# Patient Record
Sex: Male | Born: 2000 | Race: Black or African American | Hispanic: No | Marital: Single | Smoking: Never smoker
Health system: Southern US, Community
[De-identification: ages and names within clinical notes are randomized; demographics above are authoritative.]

## PROBLEM LIST (undated history)

## (undated) DIAGNOSIS — F32A Depression, unspecified: Secondary | ICD-10-CM

---

## 2020-03-06 ENCOUNTER — Ambulatory Visit (INDEPENDENT_AMBULATORY_CARE_PROVIDER_SITE_OTHER): Payer: BC Managed Care – PPO

## 2020-03-06 ENCOUNTER — Other Ambulatory Visit: Payer: Self-pay

## 2020-03-06 ENCOUNTER — Encounter (HOSPITAL_COMMUNITY): Payer: Self-pay

## 2020-03-06 ENCOUNTER — Ambulatory Visit (HOSPITAL_COMMUNITY)
Admission: EM | Admit: 2020-03-06 | Discharge: 2020-03-06 | Disposition: A | Discharge status: Home / Self Care | Payer: BC Managed Care – PPO

## 2020-03-06 DIAGNOSIS — S90212A Contusion of left great toe with damage to nail, initial encounter: Secondary | ICD-10-CM

## 2020-03-06 DIAGNOSIS — M79675 Pain in left toe(s): Secondary | ICD-10-CM | POA: Diagnosis not present

## 2020-03-06 MED ORDER — NAPROXEN 500 MG PO TABS: 500.0000 mg | ORAL_TABLET | Freq: Two times a day (BID) | ORAL | 0 refills | Status: AC

## 2020-03-06 NOTE — ED Triage Notes (Signed)
Pt c/o 5/10 throbbing pain in 1st left digit of foot. Pt states he is able to lift his toe nail up. Pt states there was yellow pus coming from under toe nail yesterday. Pt's toe nail is cracked all the way across nail. Pt states he plays football and thinks that's how he may have injured it.

## 2020-03-06 NOTE — ED Provider Notes (Signed)
[facility]    CSN: [id number] Arrival date & time: 03/06/20  1237      History   Chief Complaint Chief Complaint  Patient presents with  . Toe Pain    HPI Rodney Castillo is a 19 y.o. male.   Patient reports for left big toe pain.  Reports has been going on for about a week.  He reports last night the toe appeared white and had some yellow drainage.  Reports he is also been out of lift the toenail.  He reports prior to this there was some black and blue under the toe nail.  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  Reports pain with walking.  Reports pain with moving the toenail.  Reports there is no swelling at the base of the toenail or around the toenail.  Reports he saw his athletic trainer and they were trying to wrap it to support him while he is playing.     History reviewed. No pertinent past medical history.  There are no problems to display for this patient.   History reviewed. No pertinent surgical history.     Home Medications    Prior to Admission medications   Medication Sig Start Date End Date Taking? Authorizing Provider  cetirizine (ZYRTEC ALLERGY) 10 MG tablet Take 10 mg by mouth daily.   Yes [provider]  naproxen (NAPROSYN) 500 MG tablet Take 1 tablet (500 mg total) by mouth 2 (two) times daily for 7 days. 03/06/20 03/13/20  Shawnika Pepin, Veryl Speak, PA-C    Family History No family history on file.  Social History Social History   Tobacco Use  . Smoking status: Never Smoker  . Smokeless tobacco: Never Used  Substance Use Topics  . Alcohol use: Never  . Drug use: Never     Allergies   Patient has no known allergies.   Review of Systems Review of Systems   Physical Exam Triage Vital Signs ED Triage Vitals  Enc Vitals Group     BP 03/06/20 1326 127/70     Pulse Rate 03/06/20 1326 (!) 48     Resp 03/06/20 1326 16     Temp 03/06/20 1326 97.7 F (36.5 C)     Temp Source 03/06/20  1326 Oral     SpO2 03/06/20 1326 100 %     Weight 03/06/20 1329 175 lb (79.4 kg)     Height 03/06/20 1329 6\' 3"  (1.905 m)     Head Circumference --      Peak Flow --      Pain Score 03/06/20 1329 5     Pain Loc --      Pain Edu? --      Excl. in GC? --    No data found.  Updated Vital Signs BP 127/70   Pulse (!) 48   Temp 97.7 F (36.5 C) (Oral)   Resp 16   Ht 6\' 3"  (1.905 m)   Wt 175 lb (79.4 kg)   SpO2 100%   BMI 21.87 kg/m   Visual Acuity Right Eye Distance:   Left Eye Distance:   Bilateral Distance:    Right Eye Near:   Left Eye Near:    Bilateral Near:     Physical Exam Vitals and nursing note reviewed.  Constitutional:      Appearance: Normal appearance.  Cardiovascular:     Rate and Rhythm: Normal rate.  Musculoskeletal:  Comments: Left great toe with slight ecchymosis under toenail.  Distal nail is becoming detached from nail bed.  Significant pain with manipulation of toenail.  No erythema or swelling proximal cuticle or in the lateral nail folds.  No active drainage.  Joint is not warm.  Patient able to move the toe.  Some pain with palpation and with bearing weight.  Cap refill less than 2 seconds sensation intact.  Neurological:     General: No focal deficit present.     Mental Status: He is alert and oriented to person, place, and time.      UC Treatments / Results  Labs (all labs ordered are listed, but only abnormal results are displayed) Labs Reviewed - No data to display  EKG   Radiology DG Toe Great Left  Result Date: 03/06/2020 CLINICAL DATA:  One week of pain with possible football injury. Yellow pus coming from under the nail. EXAM: LEFT GREAT TOE COMPARISON:  None. FINDINGS: No acute fracture or dislocation. No aggressive osseous lesion. Normal alignment. No periosteal reaction or bone destruction. Soft tissue are unremarkable. Punctate radiopaque foreign body underneath the medial aspect of the nail bed. IMPRESSION: Punctate  radiopaque foreign body underneath the medial aspect of the nail bed. No radiographic evidence of osteomyelitis. Electronically Signed   By: Elige Ko   On: 03/06/2020 14:45    Procedures Procedures (including critical care time)  Medications Ordered in UC Medications - No data to display  Initial Impression / Assessment and Plan / UC Course  I have reviewed the triage vital signs and the nursing notes.  Pertinent labs & imaging results that were available during my care of the patient were reviewed by me and considered in my medical decision making (see chart for details).     #Subungual hematoma Patient is a 19 year old presenting with what appears to be recent history of a subungual hematoma causing nail detachment.  Patient was discharged prior to x-ray final read by radiology and revealed possible radiopaque foreign object under the nail.  Discussed with patient on the phone he is unsure of what this could be.  Unclear etiology of this.  Strongly encourage patient to follow-up with podiatry for evaluation of toe.  Doubt infection at this time.  Note given to limit sports activity until follow-up.  Patient verbalized agreement understanding plan of care. Final Clinical Impressions(s) / UC Diagnoses   Final diagnoses:  Subungual hematoma of great toe of left foot, initial encounter     Discharge Instructions     Call the podiatrist tomorrow, I believe you will benefit from having the nail removed  Tape toe nail down, attempt to trim the ends of your nail.   Do warm, soapy water soaks        ED Prescriptions    Medication Sig Dispense Auth. Provider   naproxen (NAPROSYN) 500 MG tablet Take 1 tablet (500 mg total) by mouth 2 (two) times daily for 7 days. 14 tablet Shemica Meath, Veryl Speak, PA-C     PDMP not reviewed this encounter.   Hermelinda Medicus, PA-C 03/06/20 1951

## 2020-03-06 NOTE — Discharge Instructions (Addendum)
Call the podiatrist tomorrow, I believe you will benefit from having the nail removed  Tape toe nail down, attempt to trim the ends of your nail.   Do warm, soapy water soaks

## 2020-03-09 ENCOUNTER — Other Ambulatory Visit: Payer: Self-pay

## 2020-03-09 ENCOUNTER — Ambulatory Visit (INDEPENDENT_AMBULATORY_CARE_PROVIDER_SITE_OTHER): Payer: BC Managed Care – PPO | Admitting: Podiatry

## 2020-03-09 DIAGNOSIS — S99922A Unspecified injury of left foot, initial encounter: Secondary | ICD-10-CM | POA: Diagnosis not present

## 2020-03-09 NOTE — Progress Notes (Signed)
  Subjective:  Patient ID: Rodney Castillo, male    DOB: 04/28/2001,  MRN: [id number]  Chief Complaint  Patient presents with  . Nail Problem    L hallux. x2 weeks. Pt stated, "I don't recall any injuries, but I do play football. The nail was black and blue, and then it turned clear w/ yellow pus 2 days later. It's tender".    19 y.o. male presents with the above complaint. History confirmed with patient.  He went to urgent care for this and x-rays were taken there.  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 Left Foot: Left hallux nail distal two thirds is lifted off, appears to be separated from the proximal nail which is healing underneath it  Radiographs: X-ray from urgent care shows punctate subungual foreign body, no evidence of fracture or infection Assessment:   1. Injury of toenail of left foot, initial encounter      Plan:  Patient was evaluated and treated and all questions answered.   -The nail appears to be suffering from microtrauma, I discussed with him that he should check the fit of his football cleats. -I recommended that we remove the loose portions of the nail and evaluate the remaining nail, and that we may have to completely excise the nail.  I anesthetized the hallux with 3 cc of 2% Xylocaine plain and 0.5% Marcaine plain 50-50 mix.  The loosened portion of the nail was excised completely it was attached laterally just a little bit.  There appears to be a new healthy nail growing underneath it.  It was thoroughly irrigated and cleaned, no foreign body was identified, there is no subungual laceration. -  I recommended that he let this grow in a postop bandage was applied and I advised him he should soak it for couple days and keep it covered with a Band-Aid and Neosporin while in his football cleats.  Okay to return  to full activity and sport  Return if symptoms worsen or fail to improve.

## 2020-03-09 NOTE — Patient Instructions (Signed)

## 2020-05-23 ENCOUNTER — Emergency Department (HOSPITAL_COMMUNITY)
Admission: EM | Admit: 2020-05-23 | Discharge: 2020-05-23 | Disposition: A | Discharge status: Home / Self Care | Payer: BC Managed Care – PPO | Attending: Emergency Medicine | Admitting: Emergency Medicine

## 2020-05-23 ENCOUNTER — Emergency Department (HOSPITAL_COMMUNITY): Payer: BC Managed Care – PPO

## 2020-05-23 ENCOUNTER — Encounter (HOSPITAL_COMMUNITY): Payer: Self-pay | Admitting: *Deleted

## 2020-05-23 ENCOUNTER — Other Ambulatory Visit: Payer: Self-pay

## 2020-05-23 DIAGNOSIS — Z20822 Contact with and (suspected) exposure to covid-19: Secondary | ICD-10-CM | POA: Diagnosis not present

## 2020-05-23 DIAGNOSIS — J029 Acute pharyngitis, unspecified: Secondary | ICD-10-CM

## 2020-05-23 DIAGNOSIS — R059 Cough, unspecified: Secondary | ICD-10-CM | POA: Diagnosis present

## 2020-05-23 HISTORY — DX: Depression, unspecified: F32.A

## 2020-05-23 LAB — RESPIRATORY PANEL BY RT PCR (FLU A&B, COVID)
Influenza A by PCR: NEGATIVE
Influenza B by PCR: NEGATIVE
SARS Coronavirus 2 by RT PCR: NEGATIVE

## 2020-05-23 LAB — MONONUCLEOSIS SCREEN: Mono Screen: NEGATIVE

## 2020-05-23 LAB — GROUP A STREP BY PCR: Group A Strep by PCR: NOT DETECTED

## 2020-05-23 MED ORDER — ACETAMINOPHEN 500 MG PO TABS
1000.0000 mg | ORAL_TABLET | Freq: Once | ORAL | Status: AC
  Administered 2020-05-23: 1000 mg via ORAL
  Filled 2020-05-23: qty 2

## 2020-05-23 NOTE — ED Provider Notes (Signed)
[facility]EMERGENCY DEPT Provider Note   CSN: [id number] Arrival date & time: 05/23/20  5638     History Chief Complaint  Patient presents with  . flu like symptoms  . Fever  . chest wall pain    Rodney Castillo is a 19 y.o. male.  HPI   Patient presented to the ED with complaints of sore throat fevers chills and chest wall pain.  Patient states his symptoms started this morning.  He started feeling feverish.  Patient also notes a sore throat and hurts to swallow.  Denies coughing but he did have some tightness in his chest today.  He denies any vomiting or diarrhea.  No rashes.  No recent ill contacts.  Patient has been vaccinated for Covid  Past Medical History:  Diagnosis Date  . Depression     There are no problems to display for this patient.   History reviewed. No pertinent surgical history.     No family history on file.  Social History   Tobacco Use  . Smoking status: Never Smoker  . Smokeless tobacco: Never Used  Substance Use Topics  . Alcohol use: Never  . Drug use: Never    Home Medications Prior to Admission medications   Medication Sig Start Date End Date Taking? Authorizing Provider  cetirizine (ZYRTEC ALLERGY) 10 MG tablet Take 10 mg by mouth daily.    [provider]    Allergies    Patient has no known allergies.  Review of Systems   Review of Systems  All other systems reviewed and are negative.   Physical Exam Updated Vital Signs BP 112/62   Pulse 78   Temp (!) 101.5 F (38.6 C) (Oral)   Resp (!) 25   Ht 1.905 m (6\' 3" )   Wt 79.4 kg   SpO2 96%   BMI 21.87 kg/m   Physical Exam Vitals and nursing note reviewed.  Constitutional:      General: He is not in acute distress.    Appearance: He is well-developed.  HENT:     Head: Normocephalic and atraumatic.     Right Ear: External ear normal.     Left Ear: External ear normal.     Mouth/Throat:     Pharynx: Oropharyngeal exudate and posterior  oropharyngeal erythema present.     Tonsils: Tonsillar exudate present. No tonsillar abscesses. 3+ on the right. 3+ on the left.  Eyes:     General: No scleral icterus.       Right eye: No discharge.        Left eye: No discharge.     Conjunctiva/sclera: Conjunctivae normal.  Neck:     Trachea: No tracheal deviation.  Cardiovascular:     Rate and Rhythm: Normal rate and regular rhythm.  Pulmonary:     Effort: Pulmonary effort is normal. No respiratory distress.     Breath sounds: Normal breath sounds. No stridor. No wheezing or rales.  Abdominal:     General: Bowel sounds are normal. There is no distension.     Palpations: Abdomen is soft.     Tenderness: There is no abdominal tenderness. There is no guarding or rebound.  Musculoskeletal:        General: No tenderness.     Cervical back: Neck supple.  Skin:    General: Skin is warm and dry.     Findings: No rash.  Neurological:     Mental Status: He is alert.     Cranial Nerves:  No cranial nerve deficit (no facial droop, extraocular movements intact, no slurred speech).     Sensory: No sensory deficit.     Motor: No abnormal muscle tone or seizure activity.     Coordination: Coordination normal.     ED Results / Procedures / Treatments   Labs (all labs ordered are listed, but only abnormal results are displayed) Labs Reviewed  RESPIRATORY PANEL BY RT PCR (FLU A&B, COVID)  GROUP A STREP BY PCR  CULTURE, GROUP A STREP [facility])  MONONUCLEOSIS SCREEN    EKG None  Radiology DG Chest Portable 1 View  Result Date: 05/23/2020 CLINICAL DATA:  Fever. EXAM: PORTABLE CHEST 1 VIEW COMPARISON:  None. FINDINGS: The heart size and mediastinal contours are within normal limits. Both lungs are clear. No visible pleural effusions or pneumothorax. The visualized skeletal structures are unremarkable. IMPRESSION: No acute cardiopulmonary disease. Electronically Signed   By: Feliberto Harts MD   On: 05/23/2020 11:46     Procedures Procedures (including critical care time)  Medications Ordered in ED Medications  acetaminophen (TYLENOL) tablet 1,000 mg (1,000 mg Oral Given 05/23/20 1108)    ED Course  I have reviewed the triage vital signs and the nursing notes.  Pertinent labs & imaging results that were available during my care of the patient were reviewed by me and considered in my medical decision making (see chart for details).  Clinical Course as of May 23 1322  Mon May 23, 2020  1146 Covid test is negative   [JK]    Clinical Course User Index [JK] Linwood Dibbles, MD   MDM Rules/Calculators/A&P                          Patient presented to the ED for evaluation of fevers chills and sore throat.  Patient had a notable fever but appears nontoxic.  He definitely had exudate and tonsillar swelling noted on exam.  Rapid strep test is negative.  I will send off a throat culture.  Mono test and Covid are also negative.  Appears stable for discharge with supportive care.  Warning signs and precautions discussed Final Clinical Impression(s) / ED Diagnoses Final diagnoses:  Pharyngitis, unspecified etiology    Rx / DC Orders ED Discharge Orders    None       Linwood Dibbles, MD 05/23/20 1324

## 2020-05-23 NOTE — Discharge Instructions (Signed)
Your flu test, Covid test, mono test and strep test were negative today.  This is likely a viral infection causing your fevers.  Take Tylenol ibuprofen.  Drink plenty of fluids and rest.  We will send off a throat culture to double check about strep throat.  If it is positive we will give you call

## 2020-05-23 NOTE — ED Triage Notes (Signed)
BIB EMS yesterday developed flu like symptoms, chest wall pain which has increased this morning. 146/[id number] 99%

## 2020-05-26 LAB — CULTURE, GROUP A STREP (THRC)

## 2021-07-08 IMAGING — DX DG TOE GREAT 2+V*L*
3 series · 3 of 3 positions shown · non-contrast
Comparison: None.

CLINICAL DATA: One week of pain with possible football injury.
Yellow pus coming from under the nail.

EXAM:
LEFT GREAT TOE

[toe ap]
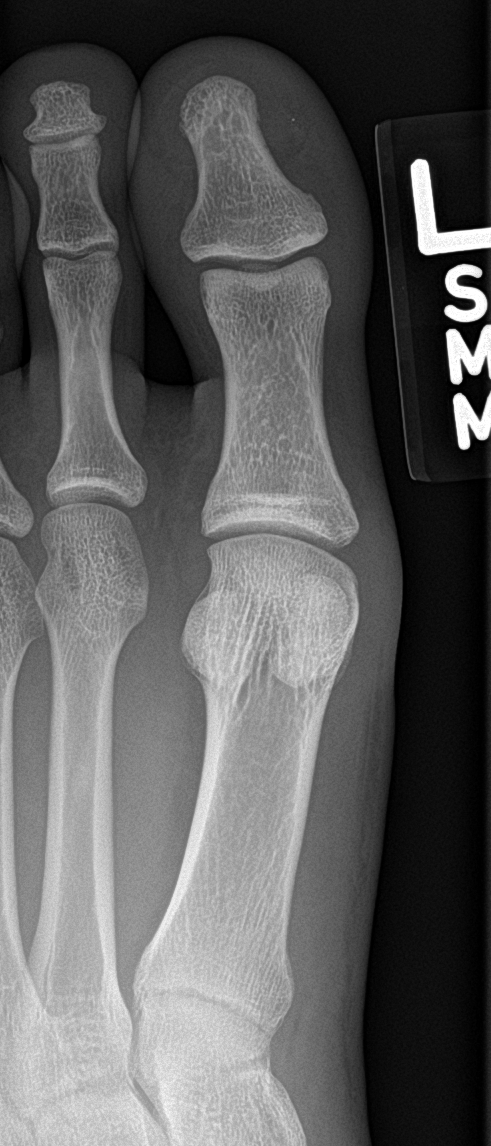

[toe obl]
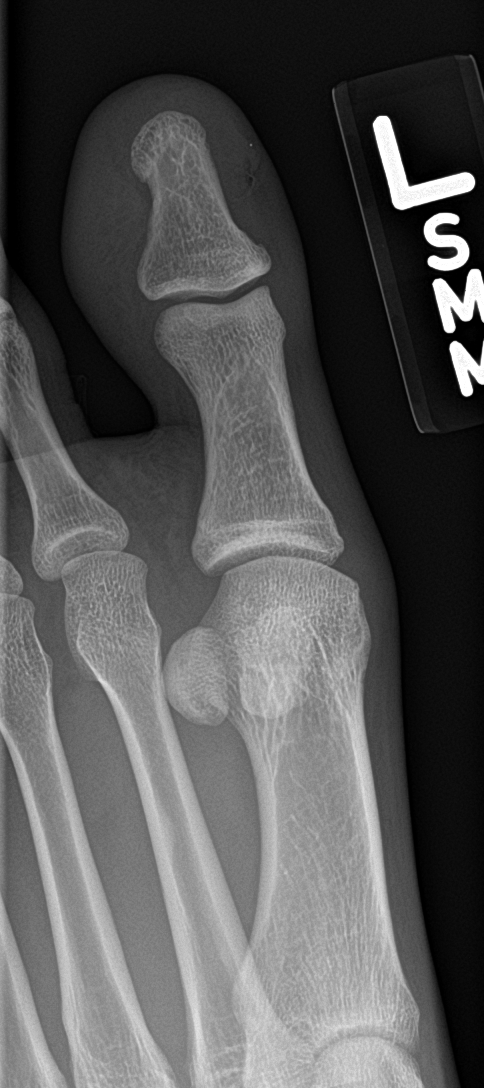

[toe lat]
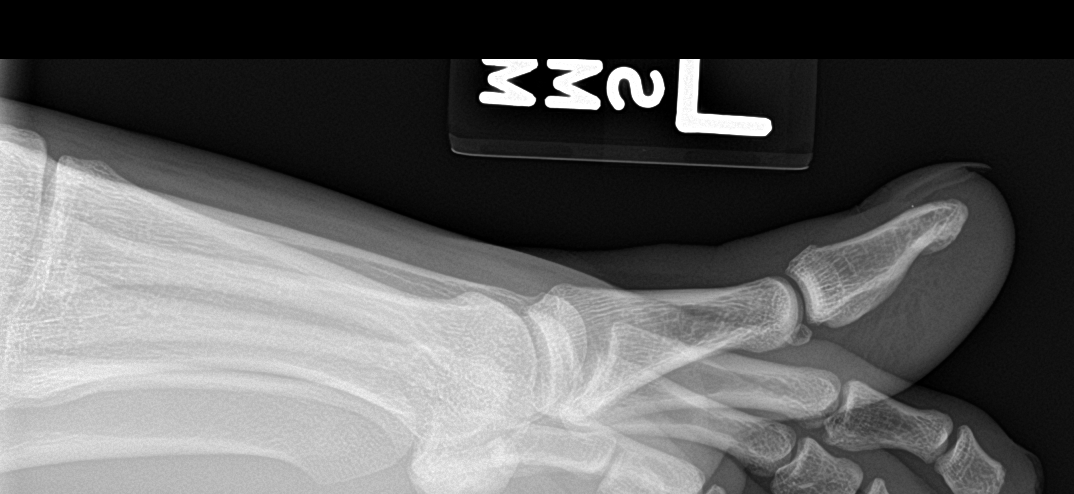

[3 of 3 positions shown; findings below may reference images not displayed]

FINDINGS: No acute fracture or dislocation. No aggressive osseous lesion.
Normal alignment. No periosteal reaction or bone destruction.

Soft tissue are unremarkable. Punctate radiopaque foreign body
underneath the medial aspect of the nail bed.
IMPRESSION: Punctate radiopaque foreign body underneath the medial aspect of the
nail bed. No radiographic evidence of osteomyelitis.
